# Patient Record
Sex: Female | Born: 1975 | Race: Black or African American | Hispanic: No | Smoking: Current every day smoker
Health system: Southern US, Community
[De-identification: ages and names within clinical notes are randomized; demographics above are authoritative.]

## PROBLEM LIST (undated history)

## (undated) DIAGNOSIS — H547 Unspecified visual loss: Secondary | ICD-10-CM

## (undated) DIAGNOSIS — D869 Sarcoidosis, unspecified: Secondary | ICD-10-CM

## (undated) DIAGNOSIS — M5136 Other intervertebral disc degeneration, lumbar region: Secondary | ICD-10-CM

## (undated) HISTORY — PX: BARIATRIC SURGERY: SHX1103

## (undated) HISTORY — PX: EYE SURGERY: SHX253

---

## 2006-07-05 ENCOUNTER — Emergency Department: Payer: Self-pay | Admitting: Internal Medicine

## 2006-11-12 ENCOUNTER — Emergency Department: Payer: Self-pay | Admitting: General Practice

## 2008-01-03 ENCOUNTER — Encounter: Admission: RE | Admit: 2008-01-03 | Discharge: 2008-01-03 | Payer: Self-pay

## 2015-04-30 ENCOUNTER — Emergency Department
Admission: EM | Admit: 2015-04-30 | Discharge: 2015-04-30 | Disposition: A | Discharge status: Home / Self Care | Payer: Medicare Other | Attending: Emergency Medicine | Admitting: Emergency Medicine

## 2015-04-30 ENCOUNTER — Encounter: Payer: Self-pay | Admitting: Emergency Medicine

## 2015-04-30 DIAGNOSIS — N390 Urinary tract infection, site not specified: Secondary | ICD-10-CM | POA: Diagnosis not present

## 2015-04-30 DIAGNOSIS — R300 Dysuria: Secondary | ICD-10-CM | POA: Diagnosis present

## 2015-04-30 DIAGNOSIS — F17200 Nicotine dependence, unspecified, uncomplicated: Secondary | ICD-10-CM | POA: Insufficient documentation

## 2015-04-30 HISTORY — DX: Sarcoidosis, unspecified: D86.9

## 2015-04-30 HISTORY — DX: Unspecified visual loss: H54.7

## 2015-04-30 HISTORY — DX: Other intervertebral disc degeneration, lumbar region: M51.36

## 2015-04-30 LAB — URINALYSIS COMPLETE WITH MICROSCOPIC (ARMC ONLY)
BILIRUBIN URINE: NEGATIVE
Glucose, UA: NEGATIVE mg/dL
KETONES UR: NEGATIVE mg/dL
NITRITE: NEGATIVE
PH: 5 (ref 5.0–8.0)
Protein, ur: NEGATIVE mg/dL
Specific Gravity, Urine: 1.016 (ref 1.005–1.030)

## 2015-04-30 MED ORDER — FLUCONAZOLE 150 MG PO TABS: 150.0000 mg | ORAL_TABLET | Freq: Once | ORAL | Status: AC

## 2015-04-30 MED ORDER — CEPHALEXIN 500 MG PO CAPS: 500.0000 mg | ORAL_CAPSULE | Freq: Two times a day (BID) | ORAL | Status: AC

## 2015-04-30 NOTE — ED Notes (Signed)
Patient presents to the ED with dysuria x 1.5 weeks, vaginal itching and vaginal discharge.  Patient states she commonly gets UTIs and this feels similar.  Patient states she called her doctor for a prescription for keflex but they wanted to see her first and did not have a convenient appointment available.  Patient is visually impaired.

## 2015-04-30 NOTE — Discharge Instructions (Signed)
Urinary Tract Infection A urinary tract infection (UTI) can occur any place along the urinary tract. The tract includes the kidneys, ureters, bladder, and urethra. A type of germ called bacteria often causes a UTI. UTIs are often helped with antibiotic medicine.  HOME CARE   If given, take antibiotics as told by your doctor. Finish them even if you start to feel better.  Drink enough fluids to keep your pee (urine) clear or pale yellow.  Avoid tea, drinks with caffeine, and bubbly (carbonated) drinks.  Pee often. Avoid holding your pee in for a long time.  Pee before and after having sex (intercourse).  Wipe from front to back after you poop (bowel movement) if you are a woman. Use each tissue only once. GET HELP RIGHT AWAY IF:   You have back pain.  You have lower belly (abdominal) pain.  You have chills.  You feel sick to your stomach (nauseous).  You throw up (vomit).  Your burning or discomfort with peeing does not go away.  You have a fever.  Your symptoms are not better in 3 days. MAKE SURE YOU:   Understand these instructions.  Will watch your condition.  Will get help right away if you are not doing well or get worse.   This information is not intended to replace advice given to you by your health care provider. Make sure you discuss any questions you have with your health care provider.   Document Released: 10/25/2007 Document Revised: 05/29/2014 Document Reviewed: 12/07/2011 Elsevier Interactive Patient Education [employer]2016 Elsevier Inc.  Take the prescription meds as directed. [facility] and follow-up in 10 days. Urine culture is pending.

## 2015-04-30 NOTE — ED Notes (Signed)
Patient declined pelvic exam due to her mences.  No swabs sent for analysis.

## 2015-04-30 NOTE — ED Provider Notes (Signed)
[facility] Emergency Department Provider Note ____________________________________________  Time seen: 1426  I have reviewed the triage vital signs and the nursing notes.  HISTORY  Chief Complaint  Dysuria and Vaginal Discharge  HPI Megan Curtis is a 39 y.o. female presents to the ED for over 1-1/2 weeks complaint of intermittent dysuria. She has also noticed some intermittent vaginal irritation and vaginal discharge. She started her menses yesterday, and is here for evaluation of her symptoms. She denies any fevers, chills, or sweats.  Past Medical History  Diagnosis Date  . Sarcoidosis (HCC)   . DDD (degenerative disc disease), lumbar   . Impaired vision    There are no active problems to display for this patient.  Past Surgical History  Procedure Laterality Date  . Cesarean section      x2  . Eye surgery    . Bariatric surgery     Current Outpatient Rx  Name  Route  Sig  Dispense  Refill  . cephALEXin (KEFLEX) 500 MG capsule   Oral   Take 1 capsule (500 mg total) by mouth 2 (two) times daily.   14 capsule   0   . fluconazole (DIFLUCAN) 150 MG tablet   Oral   Take 1 tablet (150 mg total) by mouth once.   1 tablet   0    Allergies Review of patient's allergies indicates no known allergies.  No family history on file.  Social History Social History  Substance Use Topics  . Smoking status: Current Every Day Smoker -- 0.25 packs/day  . Smokeless tobacco: None  . Alcohol Use: No   Review of Systems  Constitutional: Negative for fever. Eyes: Negative for visual changes. ENT: Negative for sore throat. Cardiovascular: Negative for chest pain. Respiratory: Negative for shortness of breath. Gastrointestinal: Negative for abdominal pain, vomiting and diarrhea. Genitourinary: Negative for dysuria. Musculoskeletal: Negative for back pain. Skin: Negative for rash. Neurological: Negative for headaches, focal weakness or  numbness. ____________________________________________  PHYSICAL EXAM:  VITAL SIGNS: ED Triage Vitals  Enc Vitals Group     BP 04/30/15 1551 145/86 mmHg     Pulse Rate 04/30/15 1551 90     Resp 04/30/15 1551 20     Temp 04/30/15 1551 98.5 F (36.9 C)     Temp Source 04/30/15 1551 Oral     SpO2 04/30/15 1551 98 %     Weight 04/30/15 1551 280 lb (127.007 kg)     Height 04/30/15 1551 5' (1.524 m)     Head Cir --      Peak Flow --      Pain Score 04/30/15 1552 8     Pain Loc --      Pain Edu? --      Excl. in GC? --    Constitutional: Alert and oriented. Well appearing and in no distress. Head: Normocephalic and atraumatic.      Eyes: Conjunctivae are normal. PERRL. Normal extraocular movements      Ears: Canals clear. TMs intact bilaterally.   Nose: No congestion/rhinorrhea.   Mouth/Throat: Mucous membranes are moist.   Neck: Supple. No thyromegaly. Hematological/Lymphatic/Immunological: No cervical lymphadenopathy. Cardiovascular: Normal rate, regular rhythm.  Respiratory: Normal respiratory effort. No wheezes/rales/rhonchi. Gastrointestinal: Soft and nontender. No distention. GU: declined by patient Musculoskeletal: Nontender with normal range of motion in all extremities.  Neurologic:  Normal gait without ataxia. Normal speech and language. No gross focal neurologic deficits are appreciated. Skin:  Skin is warm, dry and intact. No  rash noted. Psychiatric: Mood and affect are normal. Patient exhibits appropriate insight and judgment. ____________________________________________   LABS (pertinent positives/negatives) Labs Reviewed  URINALYSIS COMPLETEWITH MICROSCOPIC ([facility] ONLY) - Abnormal; Notable for the following:    Color, Urine YELLOW (*)    APPearance HAZY (*)    Hgb urine dipstick 3+ (*)    Leukocytes, UA 2+ (*)    Bacteria, UA MANY (*)    Squamous Epithelial / LPF 0-5 (*)    All other components within normal limits  URINE CULTURE   ____________________________________________  INITIAL IMPRESSION / ASSESSMENT AND PLAN / ED COURSE  Patient was an acute cystitis confirmed by labs. She will be discharged with prescriptions for Keflex and a Diflucan are provided. She will follow-up with her primary care provider at [facility] for test of cure and evaluation and management of vaginal discharge. ____________________________________________  FINAL CLINICAL IMPRESSION(S) / ED DIAGNOSES  Final diagnoses:  UTI (lower urinary tract infection)      Lissa Hoard, PA-C 04/30/15 1856  Loleta Rose, MD 04/30/15 2358

## 2015-05-02 LAB — URINE CULTURE
Culture: >=100000
Special Requests: NORMAL

## 2021-08-18 ENCOUNTER — Other Ambulatory Visit: Payer: Self-pay | Admitting: Internal Medicine

## 2021-08-18 DIAGNOSIS — H547 Unspecified visual loss: Secondary | ICD-10-CM

## 2021-08-18 DIAGNOSIS — D869 Sarcoidosis, unspecified: Secondary | ICD-10-CM

## 2021-08-18 DIAGNOSIS — R2689 Other abnormalities of gait and mobility: Secondary | ICD-10-CM

## 2021-10-04 ENCOUNTER — Ambulatory Visit
Admission: RE | Admit: 2021-10-04 | Discharge: 2021-10-04 | Disposition: A | Discharge status: Home / Self Care | Payer: Medicare Other | Source: Ambulatory Visit | Attending: Internal Medicine | Admitting: Internal Medicine

## 2021-10-04 DIAGNOSIS — D869 Sarcoidosis, unspecified: Secondary | ICD-10-CM

## 2021-10-04 DIAGNOSIS — R2689 Other abnormalities of gait and mobility: Secondary | ICD-10-CM

## 2021-10-04 DIAGNOSIS — H547 Unspecified visual loss: Secondary | ICD-10-CM

## 2021-10-04 MED ORDER — GADOPICLENOL 0.5 MMOL/ML IV SOLN
10.0000 mL | Freq: Once | INTRAVENOUS | Status: AC | PRN
  Administered 2021-10-04: 10 mL via INTRAVENOUS

## 2023-10-25 ENCOUNTER — Ambulatory Visit

## 2023-11-08 ENCOUNTER — Encounter

## 2023-11-15 ENCOUNTER — Encounter

## 2023-11-22 ENCOUNTER — Encounter

## 2023-11-29 ENCOUNTER — Encounter

## 2023-12-13 ENCOUNTER — Encounter

## 2023-12-20 ENCOUNTER — Encounter

## 2023-12-27 ENCOUNTER — Encounter

## 2024-01-03 ENCOUNTER — Encounter

## 2024-02-07 IMAGING — MR MR HEAD WO/W CM
13 series · 48 of 48 positions shown · IV contrast (10 ML VUEWAY)
Comparison: None Available.

CLINICAL DATA: Vertigo, sarcoidosis

EXAM:
MRI HEAD WITHOUT AND WITH CONTRAST
TECHNIQUE: Multiplanar, multiecho pulse sequences of the brain and surrounding
structures were obtained without and with intravenous contrast.
CONTRAST:  10 mL Vueway

[Series 4: T1 · sagittal · 5.0mm · 0.45mm/px · 1 of 25 slices shown]
[im 1/25]
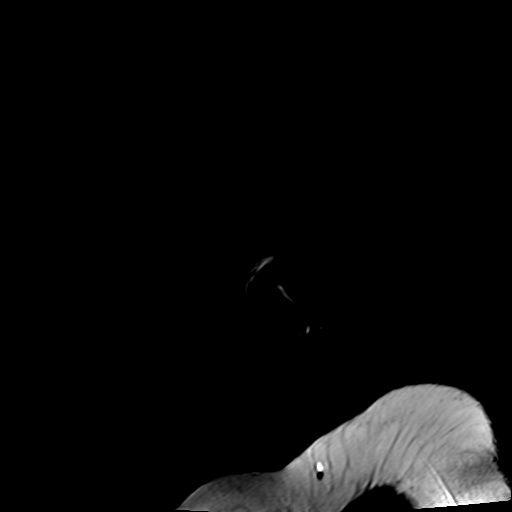

[Series 5: ax ep2d_diff_3 · axial · 3.0mm · 1.80mm/px · z∈[-98,+64]mm · 5 of 108 slices shown]
[im 1/108]
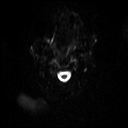
[im 27/108]
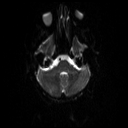
[im 54/108]
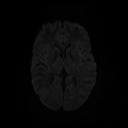
[im 81/108]
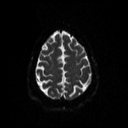
[im 108/108]
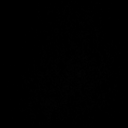

[Series 6: ax ep2d_diff_3_adc · axial · 3.0mm · 1.80mm/px · z∈[-98,+61]mm · 3 of 52 slices shown]
[im 1/52]
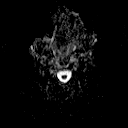
[im 26/52]
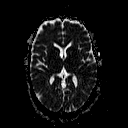
[im 52/52]
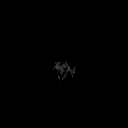

[Series 7: cor ep2d_diff · coronal · 5.0mm · 1.77mm/px · 3 of 60 slices shown]
[im 1/60]
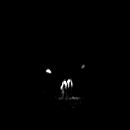
[im 30/60]
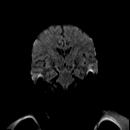
[im 60/60]
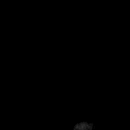

[Series 8: cor ep2d_diff_adc · coronal · 5.0mm · 1.77mm/px · 2 of 30 slices shown]
[im 1/30]
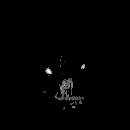
[im 30/30]
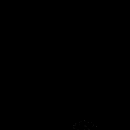

[Series 9: mip_images(sw) · axial · 16.0mm · 0.98mm/px · z∈[-88,+55]mm · 4 of 73 slices shown]
[im 1/73]
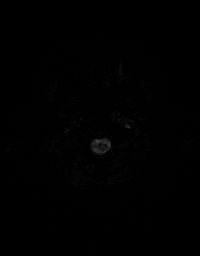
[im 25/73]
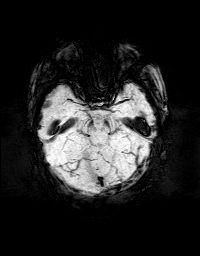
[im 49/73]
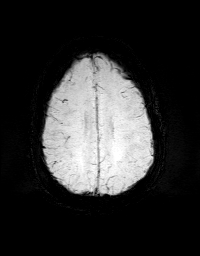
[im 73/73]
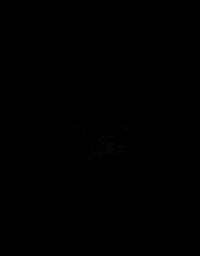

[Series 10: swi_images · axial · 2.0mm · 0.98mm/px · z∈[-95,+62]mm · 4 of 80 slices shown]
[im 1/80]
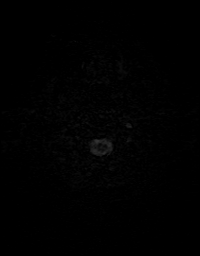
[im 27/80]
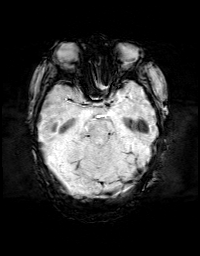
[im 53/80]
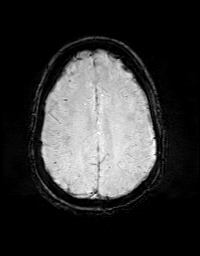
[im 80/80]
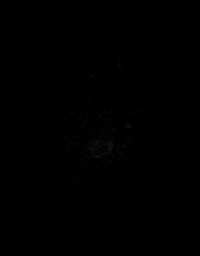

[Series 11: FLAIR · axial · 3.0mm · 0.43mm/px · z∈[-92,+59]mm · 2 of 40 slices shown]
[im 1/40]
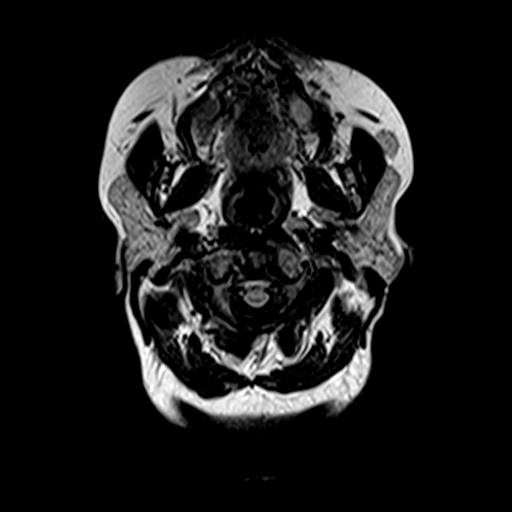
[im 40/40]
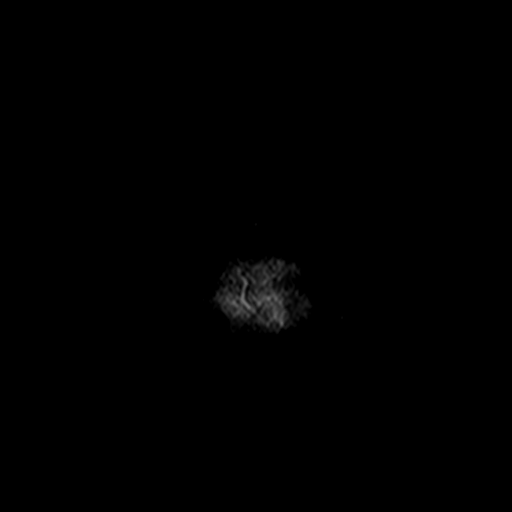

[Series 12: T2 · axial · 5.0mm · 0.65mm/px · z∈[-100,+67]mm · 2 of 29 slices shown (1 of 2)]
[im 1/29]
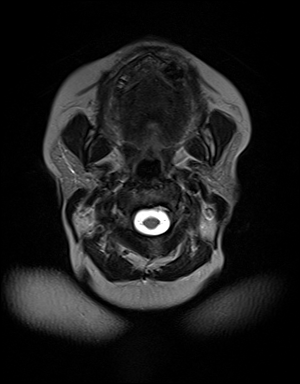
[im 29/29]
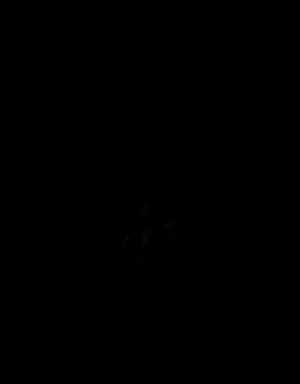

[Series 13: t1_mpr_tra · axial · 1.0mm · 0.72mm/px · z∈[-94,+64]mm · 9 of 160 slices shown]
[im 1/160]
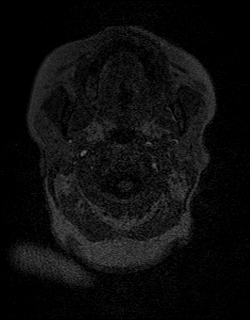
[im 20/160]
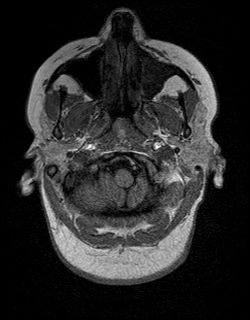
[im 40/160]
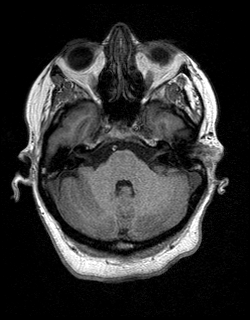
[im 60/160]
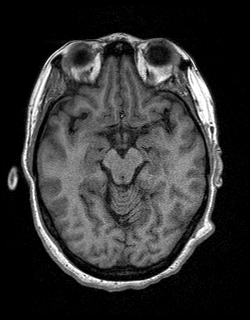
[im 80/160]
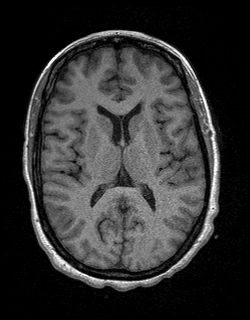
[im 100/160]
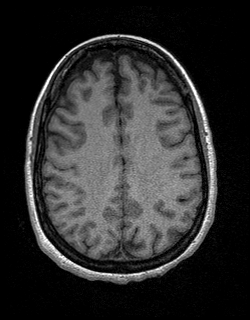
[im 120/160]
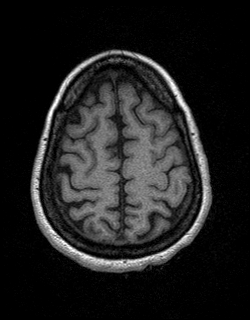
[im 140/160]
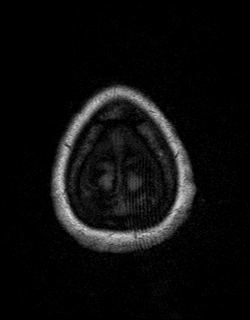
[im 160/160]
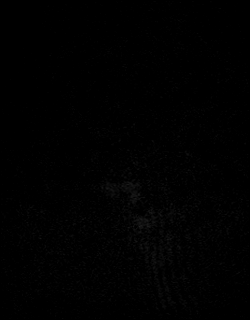

[Series 14: T2 · coronal · 5.0mm · 0.43mm/px · 2 of 28 slices shown (2 of 2)]
[im 1/28]
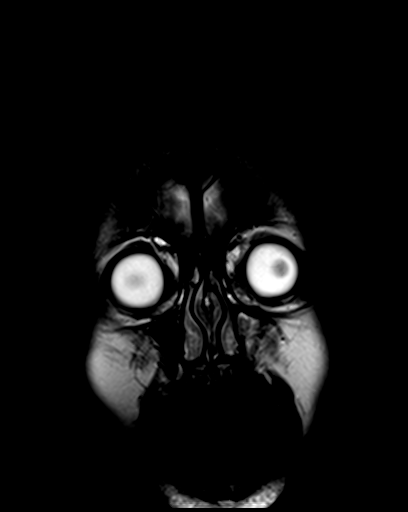
[im 28/28]
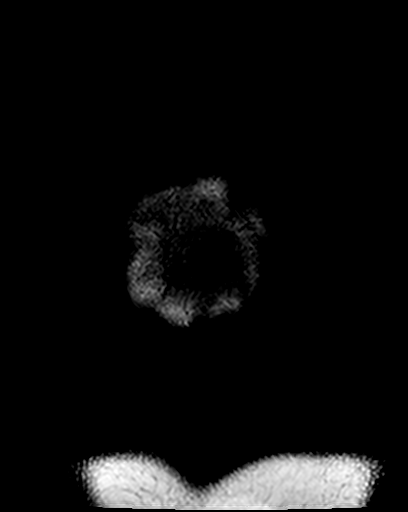

[Series 15: post t1_mpr_tra · axial · 1.0mm · 0.72mm/px · z∈[-94,+64]mm · 9 of 160 slices shown]
[im 1/160]
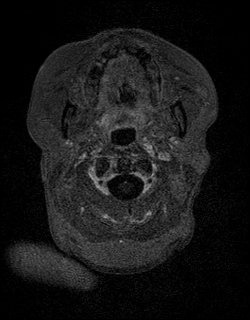
[im 20/160]
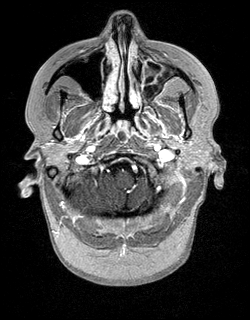
[im 40/160]
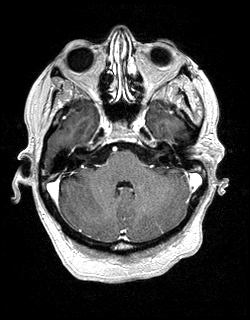
[im 60/160]
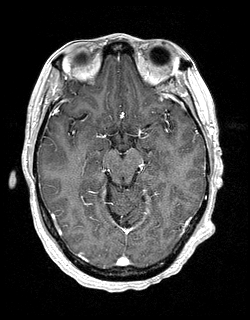
[im 80/160]
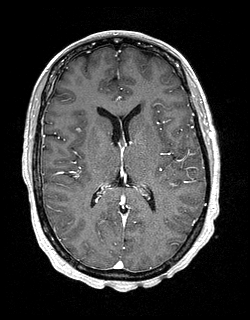
[im 100/160]
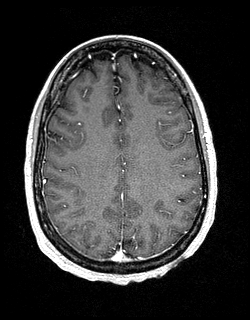
[im 120/160]
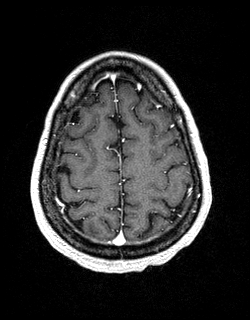
[im 140/160]
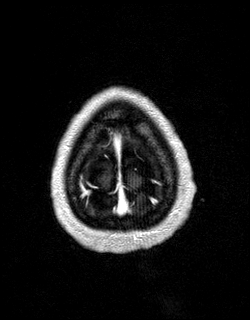
[im 160/160]
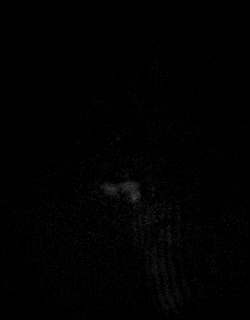

[Series 16: T1 post-contrast · coronal · 5.0mm · 0.72mm/px · 2 of 28 slices shown]
[im 1/28]
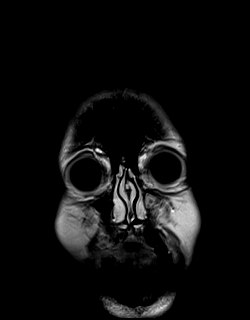
[im 28/28]
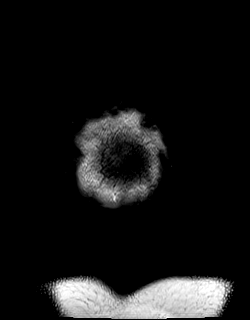

[48 of 48 positions shown; findings below may reference images not displayed]

FINDINGS: Brain: There is no acute infarction or intracranial hemorrhage.
There is no intracranial mass, mass effect, or edema. There is no
hydrocephalus or extra-axial fluid collection. Ventricles and sulci
are normal in size and configuration. Nonspecific focal T2
hyperintensity at the dorsal midline aspect of the superior pons
along the junction of cerebral aqueduct and fourth ventricle.
Additional punctate focus of T2 hyperintensity in the right frontal
subcortical white matter likely reflecting nonspecific
gliosis/demyelination. No abnormal enhancement.

Vascular: Major vessel flow voids at the skull base are preserved.

Skull and upper cervical spine: Normal marrow signal is preserved.

Sinuses/Orbits: Left maxillary sinus opacification. Orbits are
unremarkable.

Other: Sella is unremarkable.  Mastoid air cells are clear.
IMPRESSION: No evidence of recent infarction, hemorrhage, mass.

Focal abnormal T2 signal at the dorsal midline aspect of the
superior pons without associated enhancement. This may reflect
nonspecific gliosis/demyelination. Neurosarcoid is possible, but
there are no additional intracranial findings to support this.

Opacified left maxillary sinus. Acute sinusitis is possible in the
appropriate clinical setting

## 2024-06-18 NOTE — Therapy (Unsigned)
 " OUTPATIENT PHYSICAL THERAPY EVALUATION   Patient Name: Megan Curtis MRN: [id number] DOB:1975/10/07, 49 y.o., female Today's Date: 06/18/2024  END OF SESSION:   Past Medical History:  Diagnosis Date   DDD (degenerative disc disease), lumbar    Impaired vision    Sarcoidosis    Past Surgical History:  Procedure Laterality Date   BARIATRIC SURGERY     CESAREAN SECTION     x2   EYE SURGERY     There are no active problems to display for this patient.   PCP: ***  REFERRING PROVIDER: Franky Curtistine Ambrosia, DO  REFERRING DIAG: left lumbar radiculitis   Rationale for Evaluation and Treatment: Rehabilitation  THERAPY DIAG:  No diagnosis found.  ONSET DATE: ***  SUBJECTIVE:                                                                                                                                                                                           SUBJECTIVE STATEMENT: Patient is here for PT evaluation by Cert. MDT physical therapist for left lumbar radiculitis at the request of Dr. Franky Curtistine Ambrosia, DO from [facility].   From Assessment portion of Dr. Leonardo note from 06/06/24. Pt is a 49 y.o. year old female with: Chronic axial low back pain - Worse on the left side - Known grade 1/2 spondylolisthesis at L5/S1 - Has lost a significant amount of weight since we last saw her in 2019 - She is not interested in injection based treatments or surgical treatment   PERTINENT HISTORY:  Patient is a 49 y.o. female who presents to outpatient physical therapy with a referral for medical diagnosis left lumbar radiculitis. This patient's chief complaints consist of ***, leading to the following functional deficits: ***. 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 she  has a past surgical history that includes Cesarean section; Eye surgery; Bariatric Surgery, hysterectomy, paraesophageal hernia repair. Patient denies hx of {redflags:27294}  Exercise history: ***    PAIN: Are you having pain? Yes NPRS: Current: ***/10,  Best: ***/10, Worst: ***/10. Pain location: *** Pain description: *** Aggravating factors: see below Relieving factors: see below   FUNCTIONAL LIMITATIONS: ***  LEISURE: ***   PRECAUTIONS: {Therapy precautions:24002}  WEIGHT BEARING RESTRICTIONS: {Yes ***/No:24003}  FALLS:  Has patient fallen in last 6 months? {fallsyesno:27318}  LIVING ENVIRONMENT: Lives with: {[facility] lives with:25569::lives with their family}  Lives in: {Lives in:25570} Stairs: {opstairs:27293} Has following equipment at home: {Assistive devices:23999}   PATIENT GOALS: ***  NEXT MD VISIT: *** [facility] Lumbar Spine Assessment  Patient Information  Age: 73 Referral: Orth Work Demands: *** Leisure Activities: *** Functional Limitation for Present Episode: *** Outcome / Screening Score: *** NPRS (0-10): Current: ***/10,  Best: ***/10, Worst: ***/10. Present Symptoms: ***. Description: *** Present Since: ***, {ImprovingUnchangingWorsening:32694} Commenced As a Result of: {commenced as the result of:32695} Symptoms at Onset: {BackThighLeg:32696} Constant Symptoms: {BackThighLeg:32696} Intermittent Symptoms: {BackThighLeg:32696}  Worse - Bending: {AlwaysNeverSometimes:32692} - Sitting: {AlwaysNeverSometimes:32692} - Rising: {AlwaysNeverSometimes:32692} - Standing: {AlwaysNeverSometimes:32692} - Walking: {AlwaysNeverSometimes:32692} - Lying: {AlwaysNeverSometimes:32692} - AM: {AlwaysNeverSometimes:32692} - As the Day Progresses: {AlwaysNeverSometimes:32692} - PM:  {AlwaysNeverSometimes:32692} - When Still: {AlwaysNeverSometimes:32692} - On the Move: {AlwaysNeverSometimes:32692} - Other: ***:   Better - Bending: {AlwaysNeverSometimes:32692} - Sitting: {AlwaysNeverSometimes:32692} - Standing: {AlwaysNeverSometimes:32692} - Walking: {AlwaysNeverSometimes:32692} - Lying: {AlwaysNeverSometimes:32692} - AM: {AlwaysNeverSometimes:32692} - As the Day Progresses: {AlwaysNeverSometimes:32692} - PM: {AlwaysNeverSometimes:32692} - When Still: {AlwaysNeverSometimes:32692} - On the Move: {AlwaysNeverSometimes:32692} - Other: ***:   Sleep and Rest  Disturbed Sleep: {Yes/No:304960894} Sleeping Postures: {ProneSupSideRL:32697} Surface: ***  Medical History  Previous Spinal History: *** Previous Treatments: ***  SPECIFIC QUESTIONS - Cough: {yes/no:20286}, Sneeze: {yes/no:20286}, Strain: {yes/no:20286} - Bladder/Bowel: {Desc; normal/abnormal:11317::Normal} - Gait: {Desc; normal/abnormal:11317::Normal}  Medications: *** General Health/Comorbidities:  anemia, anxiety, arthritis, exotropia, GERD, hx of HTN, joint pai, 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 she  has a past surgical history that includes Cesarean section; Eye surgery; Bariatric Surgery, hysterectomy, paraesophageal hernia repair. Patient denies hx of {redflags:27294} Recent/Relevant Surgery: {Yes/No:304960894} History of Cancer: {Yes/No:304960894} Unexplained Weight Loss: {Yes/No:304960894} History of Trauma: {Yes/No:304960894} Imaging: Yes:   Lumbar xray report from 06/06/2024:  EXAM: XR LUMBAR SPINE 2 OR 3 VIEWS  DATE: 06/06/2024 10:50 AM  ACCESSION: [id number]  DICTATED: 06/06/2024 10:57 AM  INTERPRETATION LOCATION: [facility]   CLINICAL INDICATION: 49 years old Female  with chronic low back pain  - M54.50 - Chronic left - sided low back pain without sciatica - G89.29 - Chronic left - sided low back pain without sciatica    COMPARISON: 06/03/2021 abdominal radiograph, 02/26/2018 lumbar spine radiograph   TECHNIQUE: AP and lateral views of the lumbar spine.   FINDINGS:   Hardware: None   Segmentation: There are 5 non-rib bearing lumbar type verebral bodies.  Alignment: Normal lumbar lordosis. Grade 1 anterolisthesis of L4-L5. Grade 2 anterolisthesis of L5-S1.    Bones: Vertebral body heights are maintained. Posterior elements are intact.   Joints: Multilevel intervertebral disc space narrowing with degenerative endplate sclerosis and marginal osteophytes, moderate and most pronounced at L5-S1. Multilevel lumbar facet arthrosis.   Misc: No focal soft tissue abnormality. Surgical material projects over the left superior quadrant.   IMPRESSION:  Multilevel degenerative changes of the lumbar spine, moderate and most pronounced at L5-S1.  Grade 2 anterolisthesis of L5-S1 and grade 1 anterolisthesis of L4-L5.    Patient Goals/Expectations: ***  OBJECTIVE:  EXAMINATION  Sitting Posture: {LordoticNeutralKypohtic:32698} Change of Posture Effect: {BetterWorseNoEffect:32699} Standing Posture: {LordoticNeutralKypohtic:32698} Lateral Shift: {RightLeftNil:32700} Shift Relevance: {YES/NO:21197} Other Observations/Functional Baselines: ***  Neurological  Motor Deficit: *** Reflexes: *** Sensory Deficit: *** Neurodynamic Tests: ***  Movement Loss (AROM) Movement Loss Symptoms  Flexion {MajModMinNil:32701} ***  Extension {MajModMinNil:32701} ***  Side Gliding R {MajModMinNil:32701} ***  Side Gliding L {MajModMinNil:32701} ***  Other: *** {MajModMinNil:32701} ***  Repeated Motions Testing Pre-Test Symptoms Test Movement Symptom During Symptom After Mechanical Response Key Functional Test  *** *** {ProducesAbolishesIncreases:32702}  {BetterWorseNobetterNoworse:32703} {IncreasedROMDecreasedROMNoEffect:32704} {BetterWorseNoEffect:32699}  *** *** {ProducesAbolishesIncreases:32702} {BetterWorseNobetterNoworse:32703} {IncreasedROMDecreasedROMNoEffect:32704} {BetterWorseNoEffect:32699}  *** *** {ProducesAbolishesIncreases:32702} {BetterWorseNobetterNoworse:32703} {IncreasedROMDecreasedROMNoEffect:32704} {BetterWorseNoEffect:32699}  *** *** {ProducesAbolishesIncreases:32702} {BetterWorseNobetterNoworse:32703} {IncreasedROMDecreasedROMNoEffect:32704} {BetterWorseNoEffect:32699}  *** *** {ProducesAbolishesIncreases:32702} {BetterWorseNobetterNoworse:32703} {IncreasedROMDecreasedROMNoEffect:32704} {BetterWorseNoEffect:32699}  *** *** {ProducesAbolishesIncreases:32702} {BetterWorseNobetterNoworse:32703} {IncreasedROMDecreasedROMNoEffect:32704} {BetterWorseNoEffect:32699}  *** *** {ProducesAbolishesIncreases:32702} {BetterWorseNobetterNoworse:32703} {IncreasedROMDecreasedROMNoEffect:32704} {BetterWorseNoEffect:32699}   STATIC TESTS {SittingslouchedErect:32705}  OTHER TESTS: ***  Assessment  Provisional Classification: {DerangementDysfunctionPosturealOTHER:32925} Directional Preference: {FlexionExtensionSideglideOther:32927} Other: {SPINALOTHER:33021} Drivers of Pain/Disability: - Comorbidities: *** - Cognitive-Emotional: *** - Contextual: ***  Plan  PRINCIPLES OF MANAGEMENT Education: *** Exercise Type: ***  Frequency: ***  Other Exercises / Interventions: *** Management Goals: ***    TREATMENT                                                                                                                             PATIENT EDUCATION:  Education details: *** Person educated: {Person educated:25204} Education method: {Education Method:25205} Education comprehension: {Education Comprehension:25206}  HOME EXERCISE PROGRAM: ***  ASSESSMENT:  CLINICAL IMPRESSION: Patient is a 49 y.o. female referred to  outpatient physical therapy with a medical diagnosis of left lumbar radiculitis who presents with signs and symptoms consistent with ***. Patient presents with significant *** impairments that are limiting ability to complete *** without difficulty. Patient will benefit from skilled physical therapy intervention to address current body structure impairments and activity limitations to improve function and work towards goals set in current POC in order to return to prior level of function or maximal functional improvement.   Mechanical sensitivities: ***   OBJECTIVE IMPAIRMENTS: {opptimpairments:25111}.   ACTIVITY LIMITATIONS: {activitylimitations:27494}  PARTICIPATION LIMITATIONS: {participationrestrictions:25113}  PERSONAL FACTORS: {Personal factors:25162} are also affecting patient's functional outcome.   REHAB POTENTIAL: {rehabpotential:25112}  CLINICAL DECISION MAKING: {clinical decision making:25114}  EVALUATION COMPLEXITY: {Evaluation complexity:25115}   GOALS: Goals reviewed with patient? {yes/no:20286}  SHORT TERM GOALS: Target date: 07/02/2024  Patient will be independent with initial home exercise program for self-management of symptoms. Baseline: {HEPbaseline4:27310} (06/18/24); Goal status: INITIAL  LONG TERM GOALS: Target date: 09/10/2024  Patient will be independent with a long-term home exercise program for self-management of symptoms.  Baseline: {HEPbaseline4:27310} (06/18/24); Goal status: INITIAL  2.  Patient will demonstrate improved {SarasLTGPRO:32233} to demonstrate improvement in overall condition and self-reported functional ability.  Baseline: {Sarasgoalbaseline:32234} (06/18/24); Goal status: INITIAL  3.  *** Baseline: {Sarasgoalbaseline:32234} (06/18/24); Goal status: INITIAL  4.  *** Baseline: {Sarasgoalbaseline:32234} (06/18/24); Goal status: INITIAL  5.  Patient will demonstrate improvement in Patient Specific Functional Scale (PSFS) of equal  or greater than 8/10 points to reflect clinically significant improvement in patient's most valued functional activities. Baseline: {Sarasgoalbaseline:32234} (06/18/24); Goal status: INITIAL  6.  Patient will report NPRS equal or less than 3/10 during functional activities during the last 2 weeks to improve their abilitly to complete community, work and/or recreational activities with less limitation. Baseline: ***/10 (06/18/24); Goal status: INITIAL   PLAN:  PT FREQUENCY: {rehab frequency:25116}  PT DURATION: {rehab duration:25117}  PLANNED INTERVENTIONS: {rehab planned interventions:25118::97110-Therapeutic exercises,97530- Therapeutic [phone]- Neuromuscular re-education,97535- Self Rjmz,02859- Manual therapy,Patient/Family education}.  PLAN FOR NEXT SESSION: ***   Camie SAUNDERS. Juli, PT, DPT, Cert. MDT, PRA-C 06/18/24, 11:14 AM  [facility] [address] [place], [place] [address] P: [phone] I F: [phone]   "

## 2024-06-19 ENCOUNTER — Ambulatory Visit: Admitting: Physical Therapy

## 2024-06-30 ENCOUNTER — Ambulatory Visit: Admitting: Physical Therapy

## 2024-07-03 ENCOUNTER — Ambulatory Visit: Admitting: Physical Therapy

## 2024-07-08 ENCOUNTER — Ambulatory Visit: Admitting: Physical Therapy

## 2024-07-14 ENCOUNTER — Ambulatory Visit: Admitting: Physical Therapy

## 2024-07-16 ENCOUNTER — Ambulatory Visit: Admitting: Physical Therapy

## 2024-07-21 ENCOUNTER — Ambulatory Visit: Admitting: Physical Therapy

## 2024-07-23 ENCOUNTER — Ambulatory Visit: Admitting: Physical Therapy

## 2024-07-28 ENCOUNTER — Ambulatory Visit: Admitting: Physical Therapy

## 2024-07-30 ENCOUNTER — Ambulatory Visit: Admitting: Physical Therapy

## 2024-08-04 ENCOUNTER — Ambulatory Visit: Admitting: Physical Therapy

## 2024-08-06 ENCOUNTER — Ambulatory Visit: Admitting: Physical Therapy

## 2024-08-11 ENCOUNTER — Ambulatory Visit: Admitting: Physical Therapy

## 2024-08-13 ENCOUNTER — Ambulatory Visit: Admitting: Physical Therapy

## 2024-08-18 ENCOUNTER — Ambulatory Visit: Admitting: Physical Therapy

## 2024-08-20 ENCOUNTER — Ambulatory Visit: Admitting: Physical Therapy
